# Patient Record
Sex: Male | Born: 1995 | Race: Black or African American | Hispanic: No | Marital: Single | Smoking: Never smoker
Health system: Southern US, Community
[De-identification: ages and names within clinical notes are randomized; demographics above are authoritative.]

---
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
## 2015-10-17 IMAGING — DX DG CHEST 2V
2 series · 2 of 2 positions shown · non-contrast
Comparison: None.

CLINICAL DATA: Left-sided chest pain

EXAM:
CHEST  2 VIEW

[chest pa]
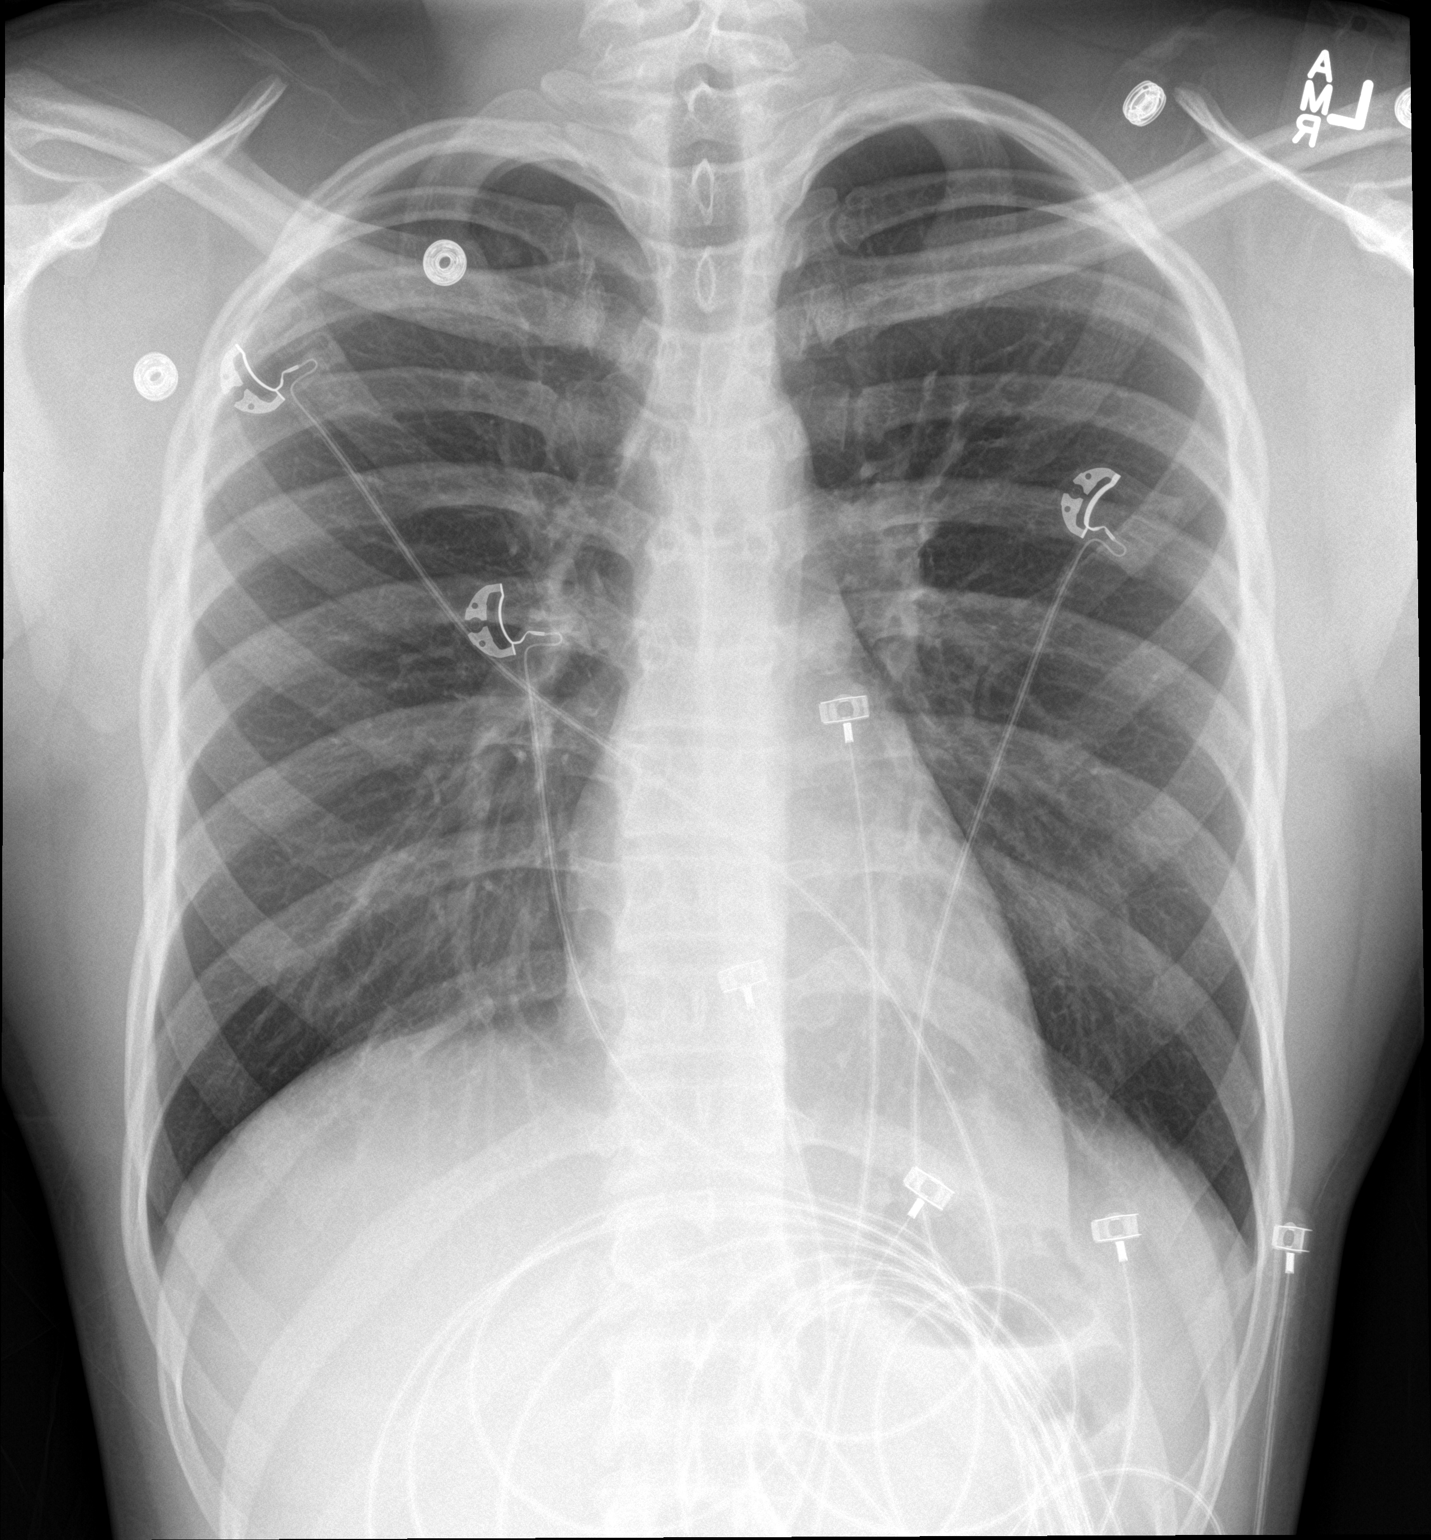

[chest lat]
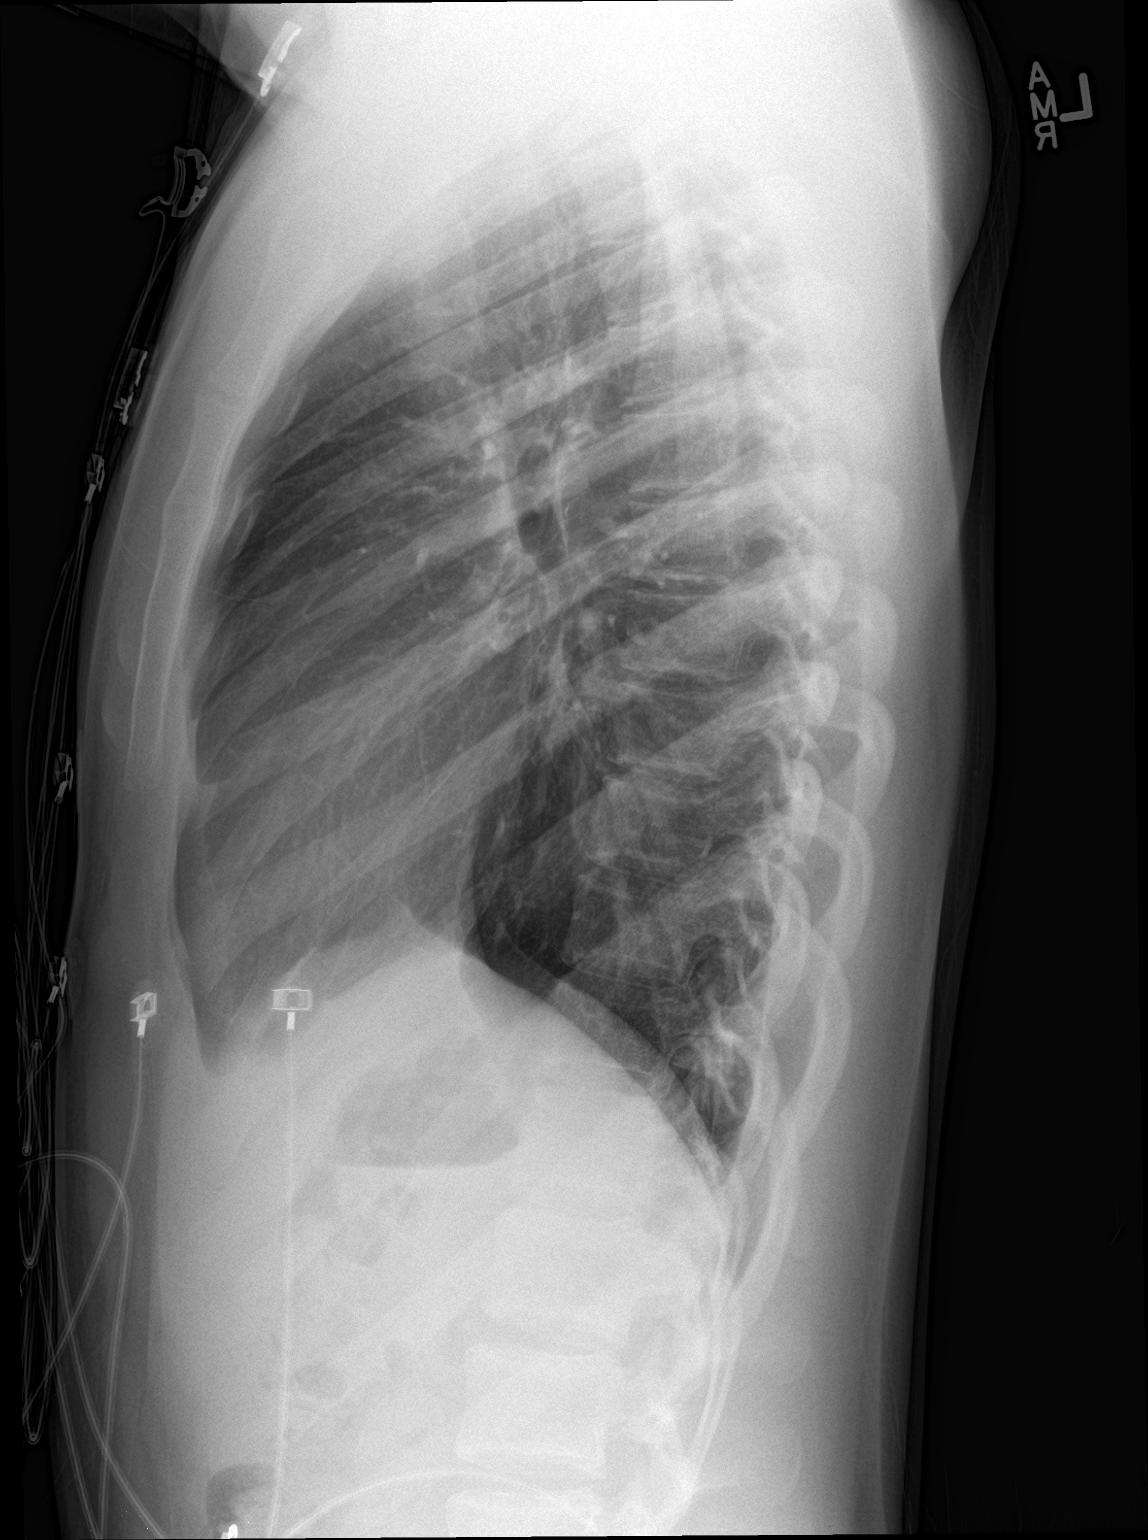

[2 of 2 positions shown; findings below may reference images not displayed]

FINDINGS: Lungs are clear. Heart size and pulmonary vascularity are normal. No
adenopathy. No pneumothorax. No bone lesions.
IMPRESSION: No abnormality noted.

## 2021-11-04 ENCOUNTER — Encounter: Payer: Self-pay | Admitting: Emergency Medicine

## 2021-11-04 ENCOUNTER — Ambulatory Visit
Admission: EM | Admit: 2021-11-04 | Discharge: 2021-11-04 | Disposition: A | Discharge status: Home / Self Care | Payer: BC Managed Care – PPO | Attending: Nurse Practitioner | Admitting: Nurse Practitioner

## 2021-11-04 ENCOUNTER — Other Ambulatory Visit: Payer: Self-pay

## 2021-11-04 DIAGNOSIS — R0781 Pleurodynia: Secondary | ICD-10-CM

## 2021-11-04 DIAGNOSIS — L209 Atopic dermatitis, unspecified: Secondary | ICD-10-CM

## 2021-11-04 MED ORDER — NAPROXEN 500 MG PO TABS: 500.0000 mg | ORAL_TABLET | Freq: Two times a day (BID) | ORAL | 0 refills | Status: DC | PRN

## 2021-11-04 MED ORDER — NAPROXEN 500 MG PO TABS: 500.0000 mg | ORAL_TABLET | Freq: Two times a day (BID) | ORAL | 0 refills | Status: AC | PRN

## 2021-11-04 NOTE — ED Provider Notes (Signed)
[facility]    CSN: [id number] Arrival date & time: 11/04/21  1106      History   Chief Complaint Chief Complaint  Patient presents with   Rib Injury    HPI Happy Ky Tomassi is a 26 y.o. male.   Patient presents for left rib pain that started on Sunday after leaning up to get out of his chair.  He denies any known accident, trauma, or injury.  He did not ear a pop or sound when this occurred.  He does report he stays very active and does 300 push-ups per day.  He says it hurts a little bit to take a deep breath and hurts worse with position changes like bending over.  He describes the pain as a 4 out of 10 in the left side of his ribs in the front of his chest; it is intermittent and aching pain.  He denies any recent cough or upper respiratory symptoms, fever, nasal congestion, shortness of breath and chest pain or tightness.  He also reports he has a rash on his upper abdomen and under his arms bilaterally that he has noticed for the past few weeks.  He denies itching, but does state it is a little bit discolored.  He denies redness or swelling.  Denies any changes in his body care products.   History reviewed. No pertinent past medical history.  There are no problems to display for this patient.   History reviewed. No pertinent surgical history.     Home Medications    Prior to Admission medications   Medication Sig Start Date End Date Taking? Authorizing Provider  naproxen (NAPROSYN) 500 MG tablet Take 1 tablet (500 mg total) by mouth 2 (two) times daily as needed for mild pain or moderate pain. Take with food to prevent GI upset 11/04/21   Valentino Nose, NP    Family History History reviewed. No pertinent family history.  Social History Social History   Tobacco Use   Smoking status: Never  Substance Use Topics   Alcohol use: No   Drug use: No     Allergies   Patient has no known allergies.   Review of Systems Review of Systems Per  HPI  Physical Exam Triage Vital Signs ED Triage Vitals  Enc Vitals Group     BP 11/04/21 1159 131/83     Pulse Rate 11/04/21 1159 77     Resp 11/04/21 1159 18     Temp 11/04/21 1159 97.9 F (36.6 C)     Temp Source 11/04/21 1159 Oral     SpO2 11/04/21 1159 96 %     Weight 11/04/21 1200 216 lb (98 kg)     Height 11/04/21 1200 5\' 11"  (1.803 m)     Head Circumference --      Peak Flow --      Pain Score 11/04/21 1200 5     Pain Loc --      Pain Edu? --      Excl. in GC? --    No data found.  Updated Vital Signs BP 131/83 (BP Location: Right Arm)   Pulse 77   Temp 97.9 F (36.6 C) (Oral)   Resp 18   Ht 5\' 11"  (1.803 m)   Wt 216 lb (98 kg)   SpO2 96%   BMI 30.13 kg/m   Visual Acuity Right Eye Distance:   Left Eye Distance:   Bilateral Distance:    Right Eye Near:  Left Eye Near:    Bilateral Near:     Physical Exam Vitals and nursing note reviewed.  Constitutional:      General: He is not in acute distress.    Appearance: Normal appearance. He is not toxic-appearing.  HENT:     Head: Normocephalic and atraumatic.     Mouth/Throat:     Mouth: Mucous membranes are moist.     Pharynx: Oropharynx is clear.  Eyes:     General: No scleral icterus.    Extraocular Movements: Extraocular movements intact.  Cardiovascular:     Rate and Rhythm: Normal rate and regular rhythm.  Pulmonary:     Effort: Pulmonary effort is normal. No respiratory distress.     Breath sounds: Normal breath sounds. No wheezing, rhonchi or rales.  Abdominal:       Comments:    Musculoskeletal:     Cervical back: Normal range of motion. No rigidity.  Skin:    General: Skin is warm and dry.     Capillary Refill: Capillary refill takes less than 2 seconds.     Coloration: Skin is not jaundiced or pale.     Findings: Rash present. No erythema.  Neurological:     Mental Status: He is alert and oriented to person, place, and time.  Psychiatric:        Behavior: Behavior is  cooperative.     UC Treatments / Results  Labs (all labs ordered are listed, but only abnormal results are displayed) Labs Reviewed - No data to display  EKG   Radiology No results found.  Procedures Procedures (including critical care time)  Medications Ordered in UC Medications - No data to display  Initial Impression / Assessment and Plan / UC Course  I have reviewed the triage vital signs and the nursing notes.  Pertinent labs & imaging results that were available during my care of the patient were reviewed by me and considered in my medical decision making (see chart for details).    Suspect rib cage pain is musculoskeletal in nature.  No red flags in history or on examination today.  Treat with NSAID twice daily, ice/heat, and massage.  Encouraged continuing to stay active, suggested limiting activities that aggravate pain.    Regarding rash, suspect atopic dermatitis.  Encouraged starting twice daily moisturizer.  Seek care if symptoms persist or worsen despite treatment. Final Clinical Impressions(s) / UC Diagnoses   Final diagnoses:  Rib pain on left side  Atopic dermatitis, unspecified type     Discharge Instructions      - The pain you are having today is likely musculoskeletal - Please start taking Naproxen twice daily regularly for the next couple of days; take with food to prevent stomach upset - You can continue to massage the area and use ice/heat to help with pain - Please try moisturizer on the areas over your chest and under your arms that are rough and dry - Seek care if your symptoms persist or worsen despite treatment     ED Prescriptions     Medication Sig Dispense Auth. Provider   naproxen (NAPROSYN) 500 MG tablet  (Status: Discontinued) Take 1 tablet (500 mg total) by mouth 2 (two) times daily as needed for mild pain or moderate pain. Take with food to prevent GI upset 30 tablet Cathlean MarseillesMartinez, Aribelle Mccosh A, NP   naproxen (NAPROSYN) 500 MG tablet  Take 1 tablet (500 mg total) by mouth 2 (two) times daily as needed for mild pain or  moderate pain. Take with food to prevent GI upset 30 tablet Valentino Nose, NP      PDMP not reviewed this encounter.   Valentino Nose, NP 11/04/21 1233

## 2021-11-04 NOTE — ED Triage Notes (Addendum)
Pt reports was leaning up in a chair on Sunday and reports left rib pain ever since. Pt reports pain is worse with movement and deep breath. Denies any known injury and reports averages 300 push ups a day.  Nad noted. Chest expansion symmetrical.

## 2021-11-04 NOTE — Discharge Instructions (Addendum)
-   The pain you are having today is likely musculoskeletal - Please start taking Naproxen twice daily regularly for the next couple of days; take with food to prevent stomach upset - You can continue to massage the area and use ice/heat to help with pain - Please try moisturizer on the areas over your chest and under your arms that are rough and dry - Seek care if your symptoms persist or worsen despite treatment

## 2022-04-18 ENCOUNTER — Encounter (HOSPITAL_COMMUNITY): Payer: Self-pay | Admitting: Emergency Medicine

## 2022-04-18 ENCOUNTER — Emergency Department (HOSPITAL_COMMUNITY)
Admission: EM | Admit: 2022-04-18 | Discharge: 2022-04-18 | Disposition: A | Discharge status: Home / Self Care | Payer: Managed Care, Other (non HMO) | Attending: Emergency Medicine | Admitting: Emergency Medicine

## 2022-04-18 DIAGNOSIS — S0990XA Unspecified injury of head, initial encounter: Secondary | ICD-10-CM

## 2022-04-18 DIAGNOSIS — Y92410 Unspecified street and highway as the place of occurrence of the external cause: Secondary | ICD-10-CM | POA: Insufficient documentation

## 2022-04-18 DIAGNOSIS — S0081XA Abrasion of other part of head, initial encounter: Secondary | ICD-10-CM | POA: Insufficient documentation

## 2022-04-18 NOTE — ED Notes (Signed)
Went over [employer]. Verbalized understanding. Ambulatory to lobby without assist
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
